# Patient Record
Sex: Female | Born: 2000 | Race: White | Hispanic: Yes | Marital: Single | Smoking: Never smoker
Health system: Southern US, Community
[De-identification: ages and names within clinical notes are randomized; demographics above are authoritative.]

## PROBLEM LIST (undated history)

## (undated) DIAGNOSIS — Z789 Other specified health status: Secondary | ICD-10-CM

## (undated) HISTORY — PX: NO PAST SURGERIES: SHX2092

## (undated) HISTORY — DX: Other specified health status: Z78.9

---
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
## 2022-04-27 ENCOUNTER — Ambulatory Visit (INDEPENDENT_AMBULATORY_CARE_PROVIDER_SITE_OTHER): Payer: Self-pay

## 2022-04-27 ENCOUNTER — Ambulatory Visit
Admission: EM | Admit: 2022-04-27 | Discharge: 2022-04-27 | Disposition: A | Discharge status: Home / Self Care | Payer: Self-pay | Attending: Nurse Practitioner | Admitting: Nurse Practitioner

## 2022-04-27 ENCOUNTER — Ambulatory Visit: Payer: Self-pay

## 2022-04-27 DIAGNOSIS — R079 Chest pain, unspecified: Secondary | ICD-10-CM

## 2022-04-27 DIAGNOSIS — R0789 Other chest pain: Secondary | ICD-10-CM

## 2022-04-27 DIAGNOSIS — F419 Anxiety disorder, unspecified: Secondary | ICD-10-CM

## 2022-04-27 MED ORDER — NAPROXEN 500 MG PO TABS: 500.0000 mg | ORAL_TABLET | Freq: Two times a day (BID) | ORAL | 0 refills | Status: AC

## 2022-04-27 MED ORDER — HYDROXYZINE HCL 25 MG PO TABS: 25.0000 mg | ORAL_TABLET | Freq: Four times a day (QID) | ORAL | 0 refills | Status: AC | PRN

## 2022-04-27 NOTE — ED Provider Notes (Signed)
[facility]    CSN: [id number] Arrival date & time: 04/27/22  1143      History   Chief Complaint Chief Complaint  Patient presents with   Chest Pain    HPI Barbara Payne is a 22 y.o. female Libby Maw for evaluation of chest pain.  Patient reports 1 month of a intermittent migrating stabbing type chest pain that is associated with back pain, bilateral arm pain that alternates.  States symptoms began after she lifted a heavy object 1 month ago, thinking it weighed somewhere around 50 pounds.  She denies shortness of breath, dizziness, syncope, palpitations, vomiting.  She does states she has intermittent nausea.  No smoking history.  Denies any history of hypertension, diabetes, hyperlipidemia.  Denies any lower extremity swelling orthopnea.  No hemoptysis.  No long distance travel.  She states she had the flu a month ago with the symptoms completely resolved.  She states pain is worse with deep inspiration and lifting of any objects and better/resolved with Motrin.  She is very concerned about the symptoms which is causing her anxiety to increase.  No other concerns at this time.   Chest Pain   History reviewed. No pertinent past medical history.  There are no problems to display for this patient.   History reviewed. No pertinent surgical history.  OB History   No obstetric history on file.      Home Medications    Prior to Admission medications   Medication Sig Start Date End Date Taking? Authorizing Provider  hydrOXYzine (ATARAX) 25 MG tablet Take 1 tablet (25 mg total) by mouth every 6 (six) hours as needed for anxiety. 04/27/22  Yes Melynda Ripple, NP  naproxen (NAPROSYN) 500 MG tablet Take 1 tablet (500 mg total) by mouth 2 (two) times daily for 7 days. 04/27/22 05/04/22 Yes Melynda Ripple, NP  lactobacillus acidophilus & bulgar (LACTINEX) chewable tablet Chew 1 tablet by mouth 3 (three) times daily with meals. For 5 days 05/04/14   Harlene Salts, MD  ondansetron  (ZOFRAN ODT) 4 MG disintegrating tablet Take 1 tablet (4 mg total) by mouth every 8 (eight) hours as needed. 05/04/14   Harlene Salts, MD    Family History History reviewed. No pertinent family history.  Social History Social History   Tobacco Use   Smoking status: Never   Smokeless tobacco: Never  Substance Use Topics   Alcohol use: Never   Drug use: Never     Allergies   Patient has no known allergies.   Review of Systems Review of Systems  Cardiovascular:  Positive for chest pain.     Physical Exam Triage Vital Signs ED Triage Vitals  Enc Vitals Group     BP 04/27/22 1211 117/79     Pulse Rate 04/27/22 1211 76     Resp 04/27/22 1211 18     Temp 04/27/22 1211 (!) 97.3 F (36.3 C)     Temp Source 04/27/22 1211 Oral     SpO2 04/27/22 1211 98 %     Weight --      Height --      Head Circumference --      Peak Flow --      Pain Score 04/27/22 1209 5     Pain Loc --      Pain Edu? --      Excl. in [place]? --    No data found.  Updated Vital Signs BP 117/79 (BP Location: Left Arm)   Pulse  76   Temp (!) 97.3 F (36.3 C) (Oral)   Resp 18   LMP 04/15/2022 (Approximate)   SpO2 98%   Visual Acuity Right Eye Distance:   Left Eye Distance:   Bilateral Distance:    Right Eye Near:   Left Eye Near:    Bilateral Near:     Physical Exam Vitals and nursing note reviewed.  Constitutional:      General: She is not in acute distress.    Appearance: Normal appearance. She is obese. She is not ill-appearing.  HENT:     Head: Normocephalic and atraumatic.  Eyes:     Pupils: Pupils are equal, round, and reactive to light.  Cardiovascular:     Rate and Rhythm: Normal rate and regular rhythm.     Heart sounds: Normal heart sounds. No murmur heard.    No friction rub.     Comments: There is tenderness to palpation to the entire chest that stops at the breast line.  She is also tender to palpation to bilateral arms, and to entire her upper and mid back.  No spinal  tenderness with palpation.  No neck pain.  Strength is 5 out of 5 bilateral upper extremities.  Equal chest expansion bilaterally. Pulmonary:     Effort: Pulmonary effort is normal. No respiratory distress.     Breath sounds: Normal breath sounds. No stridor. No wheezing, rhonchi or rales.  Chest:     Chest wall: Tenderness present.  Skin:    General: Skin is warm and dry.  Neurological:     General: No focal deficit present.     Mental Status: She is alert and oriented to person, place, and time.  Psychiatric:        Behavior: Behavior normal.     Comments: Patient crying intermittently     PERC Criteria for low probability for Pulmonary Embolism  Age <50 years  Heart rate <100 beats/minute  Oxyhemoglobin saturation ?95 percent  No hemoptysis  No estrogen use  No prior DVT or PE  No unilateral leg swelling  No surgery/trauma requiring hospitalization within the prior four weeks  Total score: 0     UC Treatments / Results  Labs (all labs ordered are listed, but only abnormal results are displayed) Labs Reviewed - No data to display  EKG   Radiology DG Chest 2 View  Result Date: 04/27/2022 CLINICAL DATA:  22 year old female with chest pain. EXAM: CHEST - 2 VIEW COMPARISON:  CT Abdomen and Pelvis 05/04/2014. FINDINGS: PA and lateral views at 1232 hours. Normal lung volumes and mediastinal contours. Visualized tracheal air column is within normal limits. Both lungs appear clear. No pneumothorax or pleural effusion. Negative visible bowel gas pattern, osseous structures. IMPRESSION: Negative.  No cardiopulmonary abnormality. Electronically Signed   By: Genevie Ann M.D.   On: 04/27/2022 12:40    Procedures ED EKG  Date/Time: 04/27/2022 12:44 PM  Performed by: Melynda Ripple, NP Authorized by: Melynda Ripple, NP   Previous ECG:    Previous ECG:  Unavailable Interpretation:    Interpretation: normal   Rate:    ECG rate:  68   ECG rate assessment: normal   Rhythm:     Rhythm: sinus rhythm   Ectopy:    Ectopy: none   ST segments:    ST segments:  Normal T waves:    T waves: normal    (including critical care time)  Medications Ordered in UC Medications - No data to display  Initial Impression / Assessment and Plan / UC Course  I have reviewed the triage vital signs and the nursing notes.  Pertinent labs & imaging results that were available during my care of the patient were reviewed by me and considered in my medical decision making (see chart for details).     Reviewed exam and symptoms with patient. Negative chest x-ray and EKG is unremarkable Discussed exam consistent with musculoskeletal cause.  PERC score is 0. Trial of naproxen twice daily for 7 days Trial of hydroxyzine as needed anxiety.  Side effect profile reviewed Patient does not have a PCP and did not want to try to set one up today.  I did strongly encourage her to set up with a PCP for further evaluation if symptoms persist as well as overall health maintenance ER precautions reviewed and she verbalized understanding Final Clinical Impressions(s) / UC Diagnoses   Final diagnoses:  Chest pain, unspecified type  Musculoskeletal chest pain  Anxiety     Discharge Instructions      Start naproxen twice daily for 7 days.  Take this with food Hydroxyzine every 6 hours as needed for anxiety.  Please note this medication can make you drowsy I encourage you to establish with a primary care doctor for further treatment of the symptoms if they persist and overall health maintenance Please go to the emergency room if you feel like your symptoms are worsening     ED Prescriptions     Medication Sig Dispense Auth. Provider   naproxen (NAPROSYN) 500 MG tablet Take 1 tablet (500 mg total) by mouth 2 (two) times daily for 7 days. 14 tablet Melynda Ripple, NP   hydrOXYzine (ATARAX) 25 MG tablet Take 1 tablet (25 mg total) by mouth every 6 (six) hours as needed for anxiety. 12 tablet  Melynda Ripple, NP      PDMP not reviewed this encounter.   Melynda Ripple, NP 04/27/22 1250

## 2022-04-27 NOTE — Discharge Instructions (Signed)
Start naproxen twice daily for 7 days.  Take this with food Hydroxyzine every 6 hours as needed for anxiety.  Please note this medication can make you drowsy I encourage you to establish with a primary care doctor for further treatment of the symptoms if they persist and overall health maintenance Please go to the emergency room if you feel like your symptoms are worsening

## 2022-04-27 NOTE — ED Triage Notes (Signed)
Pt c/o chest pressure and pain that started while lifting a heavy object x 1 month in the center of chest that radiates out to side of ribs with deep breathing and arm pain that rotates between left and right arms that feels like stabbing pain. Pt c/o Lightheadedness and dizziness at time, N, HA, and blurry vision with physical activity No F. Pt reports having the flu about 1 month ago with cough. Cough has resolved.  Home remedies: Motrin.

## 2022-05-31 ENCOUNTER — Emergency Department (HOSPITAL_COMMUNITY)
Admission: EM | Admit: 2022-05-31 | Discharge: 2022-06-01 | Disposition: A | Discharge status: Home / Self Care | Payer: Self-pay | Attending: Student | Admitting: Student

## 2022-05-31 DIAGNOSIS — R112 Nausea with vomiting, unspecified: Secondary | ICD-10-CM | POA: Insufficient documentation

## 2022-05-31 DIAGNOSIS — D72829 Elevated white blood cell count, unspecified: Secondary | ICD-10-CM | POA: Insufficient documentation

## 2022-05-31 DIAGNOSIS — R1031 Right lower quadrant pain: Secondary | ICD-10-CM | POA: Insufficient documentation

## 2022-05-31 MED ORDER — ONDANSETRON 4 MG PO TBDP
8.0000 mg | ORAL_TABLET | Freq: Once | ORAL | Status: AC
  Administered 2022-06-01: 8 mg via ORAL
  Filled 2022-05-31: qty 2

## 2022-05-31 MED ORDER — OXYCODONE-ACETAMINOPHEN 5-325 MG PO TABS
1.0000 | ORAL_TABLET | Freq: Once | ORAL | Status: AC
  Administered 2022-06-01: 1 via ORAL
  Filled 2022-05-31: qty 1

## 2022-05-31 NOTE — ED Triage Notes (Signed)
Pt reports RLQ pain starting today. She states no BM today. Nausea and vomiting.

## 2022-05-31 NOTE — ED Provider Triage Note (Signed)
Emergency Medicine Provider Triage Evaluation Note  Niyoka Sahlberg , a 22 y.o. female  was evaluated in triage.  Pt complains of right lower quadrant pain started few hours ago, came on suddenly, states it is severe in nature, does not radiate, has associated nausea vomiting, show states that she is constipated x 1 day, currently not on birth control, last menstrual cycle was last month, states she is regular, no history of abdominal surgeries ectopic pregnancy, ovarian cyst, had associated fevers and chills.  Review of Systems  Positive: Right lower quadrant pain, nausea Negative: Chest pain or shortness of breath  Physical Exam  BP 115/77 (BP Location: Right Arm)   Pulse 85   Temp 98.4 F (36.9 C)   Resp 20   LMP 05/16/2022   SpO2 100%  Gen:   Awake, no distress   Resp:  Normal effort  MSK:   Moves extremities without difficulty  Other:    Medical Decision Making  Medically screening exam initiated at 11:44 PM.  Appropriate orders placed.  Tanis Brecker was informed that the remainder of the evaluation will be completed by another provider, this initial triage assessment does not replace that evaluation, and the importance of remaining in the ED until their evaluation is complete.  Lab work imaging been ordered patient will need further workup.   Carroll Sage, PA-C 05/31/22 2345

## 2022-06-01 ENCOUNTER — Emergency Department (HOSPITAL_COMMUNITY): Payer: Self-pay

## 2022-06-01 LAB — COMPREHENSIVE METABOLIC PANEL
ALT: 31 U/L (ref 0–44)
AST: 23 U/L (ref 15–41)
Albumin: 4 g/dL (ref 3.5–5.0)
Alkaline Phosphatase: 80 U/L (ref 38–126)
Anion gap: 10 (ref 5–15)
BUN: 10 mg/dL (ref 6–20)
CO2: 24 mmol/L (ref 22–32)
Calcium: 9.2 mg/dL (ref 8.9–10.3)
Chloride: 103 mmol/L (ref 98–111)
Creatinine, Ser: 0.73 mg/dL (ref 0.44–1.00)
GFR, Estimated: >60 mL/min (ref >60)
Glucose, Bld: 112 mg/dL — ABNORMAL HIGH (ref 70–99)
Potassium: 4.1 mmol/L (ref 3.5–5.1)
Sodium: 137 mmol/L (ref 135–145)
Total Bilirubin: 0.4 mg/dL (ref 0.3–1.2)
Total Protein: 7.4 g/dL (ref 6.5–8.1)

## 2022-06-01 LAB — CBC WITH DIFFERENTIAL/PLATELET
Abs Immature Granulocytes: 0.03 10*3/uL (ref 0.00–0.07)
Basophils Absolute: 0.2 10*3/uL — ABNORMAL HIGH (ref 0.0–0.1)
Basophils Relative: 2 %
Eosinophils Absolute: 0.1 10*3/uL (ref 0.0–0.5)
Eosinophils Relative: 1 %
HCT: 41 % (ref 36.0–46.0)
Hemoglobin: 13.3 g/dL (ref 12.0–15.0)
Immature Granulocytes: 0 %
Lymphocytes Relative: 33 %
Lymphs Abs: 3.5 10*3/uL (ref 0.7–4.0)
MCH: 29.2 pg (ref 26.0–34.0)
MCHC: 32.4 g/dL (ref 30.0–36.0)
MCV: 89.9 fL (ref 80.0–100.0)
Monocytes Absolute: 0.9 10*3/uL (ref 0.1–1.0)
Monocytes Relative: 9 %
Neutro Abs: 5.8 10*3/uL (ref 1.7–7.7)
Neutrophils Relative %: 55 %
Platelets: 292 10*3/uL (ref 150–400)
RBC: 4.56 MIL/uL (ref 3.87–5.11)
RDW: 12.7 % (ref 11.5–15.5)
WBC: 10.6 10*3/uL — ABNORMAL HIGH (ref 4.0–10.5)
nRBC: 0 % (ref 0.0–0.2)

## 2022-06-01 LAB — I-STAT BETA HCG BLOOD, ED (MC, WL, AP ONLY): I-stat hCG, quantitative: <5 m[IU]/mL (ref <5)

## 2022-06-01 LAB — LIPASE, BLOOD: Lipase: 36 U/L (ref 11–51)

## 2022-06-01 LAB — I-STAT CHEM 8, ED
BUN: 12 mg/dL (ref 6–20)
Calcium, Ion: 1.18 mmol/L (ref 1.15–1.40)
Chloride: 101 mmol/L (ref 98–111)
Creatinine, Ser: 0.6 mg/dL (ref 0.44–1.00)
Glucose, Bld: 110 mg/dL — ABNORMAL HIGH (ref 70–99)
HCT: 40 % (ref 36.0–46.0)
Hemoglobin: 13.6 g/dL (ref 12.0–15.0)
Potassium: 3.9 mmol/L (ref 3.5–5.1)
Sodium: 138 mmol/L (ref 135–145)
TCO2: 27 mmol/L (ref 22–32)

## 2022-06-01 LAB — URINALYSIS, ROUTINE W REFLEX MICROSCOPIC
Bilirubin Urine: NEGATIVE
Glucose, UA: NEGATIVE mg/dL
Hgb urine dipstick: NEGATIVE
Ketones, ur: NEGATIVE mg/dL
Leukocytes,Ua: NEGATIVE
Nitrite: NEGATIVE
Protein, ur: NEGATIVE mg/dL
Specific Gravity, Urine: 1.021 (ref 1.005–1.030)
pH: 6 (ref 5.0–8.0)

## 2022-06-01 MED ORDER — SODIUM CHLORIDE 0.9 % IV BOLUS
1000.0000 mL | Freq: Once | INTRAVENOUS | Status: AC
  Administered 2022-06-01: 1000 mL via INTRAVENOUS

## 2022-06-01 MED ORDER — IOHEXOL 350 MG/ML SOLN
75.0000 mL | Freq: Once | INTRAVENOUS | Status: AC | PRN
  Administered 2022-06-01: 75 mL via INTRAVENOUS

## 2022-06-01 MED ORDER — ONDANSETRON HCL 4 MG/2ML IJ SOLN
4.0000 mg | Freq: Once | INTRAMUSCULAR | Status: AC
  Administered 2022-06-01: 4 mg via INTRAVENOUS
  Filled 2022-06-01: qty 2

## 2022-06-01 MED ORDER — ONDANSETRON 4 MG PO TBDP: 4.0000 mg | ORAL_TABLET | Freq: Three times a day (TID) | ORAL | 0 refills | Status: AC | PRN

## 2022-06-01 NOTE — ED Notes (Signed)
Pt alert, NAD, calm, interactive, resps e/u, speaking in clear complete sentences. Family at [facility]. Pt reports improved pain and nausea. Last BM yesterday / loose. Denies vaginal or urinary sx, or bleeding. "Feels better". Pending CT.

## 2022-06-01 NOTE — ED Provider Notes (Signed)
[place] EMERGENCY DEPARTMENT AT [facility] Provider Note   CSN: [id number] Arrival date & time: 05/31/22  2257     History  Chief Complaint  Patient presents with   Abdominal Pain    Barbara Payne is a 22 y.o. female.  HPI   Patient without significant medical history presents with complains of right lower quadrant pain started few hours ago, came on suddenly, states it is severe in nature, does not radiate, has associated nausea vomiting, show states that she is constipated x 1 day, currently not on birth control, last menstrual cycle was last month, states she is regular, no history of abdominal surgeries ectopic pregnancy, ovarian cyst, had associated fevers and chills, patient does not endorsing any urinary symptoms.  Home Medications Prior to Admission medications   Medication Sig Start Date End Date Taking? Authorizing Provider  hydrOXYzine (ATARAX) 25 MG tablet Take 1 tablet (25 mg total) by mouth every 6 (six) hours as needed for anxiety. 04/27/22   Radford Pax, NP  lactobacillus acidophilus & bulgar (LACTINEX) chewable tablet Chew 1 tablet by mouth 3 (three) times daily with meals. For 5 days 05/04/14   Ree Shay, MD  ondansetron (ZOFRAN ODT) 4 MG disintegrating tablet Take 1 tablet (4 mg total) by mouth every 8 (eight) hours as needed. 05/04/14   Ree Shay, MD      Allergies    Patient has no known allergies.    Review of Systems   Review of Systems  Constitutional:  Negative for chills and fever.  Respiratory:  Negative for shortness of breath.   Cardiovascular:  Negative for chest pain.  Gastrointestinal:  Positive for abdominal pain and nausea. Negative for vomiting.  Neurological:  Negative for headaches.    Physical Exam Updated Vital Signs BP (!) 105/55 (BP Location: Right Arm)   Pulse 67   Temp 97.6 F (36.4 C) (Oral)   Resp 18   LMP 05/16/2022   SpO2 100%  Physical Exam Vitals and nursing note reviewed.  Constitutional:       General: She is not in acute distress.    Appearance: She is not ill-appearing.  HENT:     Head: Normocephalic and atraumatic.     Nose: No congestion.  Eyes:     Conjunctiva/sclera: Conjunctivae normal.  Cardiovascular:     Rate and Rhythm: Normal rate and regular rhythm.     Pulses: Normal pulses.     Heart sounds: No murmur heard.    No friction rub. No gallop.  Pulmonary:     Effort: No respiratory distress.     Breath sounds: No wheezing, rhonchi or rales.  Abdominal:     Palpations: Abdomen is soft.     Tenderness: There is abdominal tenderness. There is no right CVA tenderness or left CVA tenderness.     Comments: Abdomen nondistended, soft, noted right lower quadrant tenderness without guarding rebound has or peritoneal sign.  No flank tenderness or CVA tenderness.  Musculoskeletal:     Right lower leg: No edema.     Left lower leg: No edema.  Skin:    General: Skin is warm and dry.  Neurological:     Mental Status: She is alert.  Psychiatric:        Mood and Affect: Mood normal.     ED Results / Procedures / Treatments   Labs (all labs ordered are listed, but only abnormal results are displayed) Labs Reviewed  COMPREHENSIVE METABOLIC PANEL - Abnormal; Notable for  the following components:      Result Value   Glucose, Bld 112 (*)    All other components within normal limits  CBC WITH DIFFERENTIAL/PLATELET - Abnormal; Notable for the following components:   WBC 10.6 (*)    Basophils Absolute 0.2 (*)    All other components within normal limits  URINALYSIS, ROUTINE W REFLEX MICROSCOPIC - Abnormal; Notable for the following components:   APPearance HAZY (*)    All other components within normal limits  I-STAT CHEM 8, ED - Abnormal; Notable for the following components:   Glucose, Bld 110 (*)    All other components within normal limits  LIPASE, BLOOD  I-STAT BETA HCG BLOOD, ED ([facility], [facility], [facility] ONLY)    EKG None  Radiology No results  found.  Procedures Procedures    Medications Ordered in ED Medications  ondansetron (ZOFRAN-ODT) disintegrating tablet 8 mg (8 mg Oral Given 06/01/22 0002)  oxyCODONE-acetaminophen (PERCOCET/ROXICET) 5-325 MG per tablet 1 tablet (1 tablet Oral Given 06/01/22 0003)  sodium chloride 0.9 % bolus 1,000 mL (1,000 mLs Intravenous New Bag/Given 06/01/22 0630)  ondansetron (ZOFRAN) injection 4 mg (4 mg Intravenous Given 06/01/22 0630)    ED Course/ Medical Decision Making/ A&P                             Medical Decision Making Amount and/or Complexity of Data Reviewed Labs: ordered. Radiology: ordered.  Risk Prescription drug management.   This patient presents to the ED for concern of right lower quadrant pain, this involves an extensive number of treatment options, and is a complaint that carries with it a high risk of complications and morbidity.  The differential diagnosis includes appendicitis, diverticulitis, ovarian torsion, ectopic pregnancy, kidney stone    Additional history obtained:  Additional history obtained from parent at bedside External records from outside source obtained and reviewed including recent ER notes   Co morbidities that complicate the patient evaluation  N/A  Social Determinants of Health:  No primary care provider    Lab Tests:  I Ordered, and personally interpreted labs.  The pertinent results include: CBC shows leukocytosis of 10.6, CMP reveals glucose of 112, lipase is 36, UA unremarkable, hCG negative   Imaging Studies ordered:  I ordered imaging studies including CT AP I independently visualized and interpreted imaging which showed pending I agree with the radiologist interpretation   Cardiac Monitoring:  The patient was maintained on a cardiac monitor.  I personally viewed and interpreted the cardiac monitored which showed an underlying rhythm of: N/A   Medicines ordered and prescription drug management:  I ordered  medication including fluids, pain medication I have reviewed the patients home medicines and have made adjustments as needed  Critical Interventions:  N/A   Reevaluation:  Presenting with right lower quadrant tenderness, triage obtained lab work shows leukocytosis, right lower quadrant tenderness, concern for appendicitis will send down for CT imaging for further evaluation.   Consultations Obtained:  N/A    Test Considered:  N/A    Rule out  I have low suspicion for liver or gallbladder abnormality as she has no right upper quadrant tenderness, liver enzymes, alk phos, T bili all within normal limits.  Low suspicion for pancreatitis as lipase is within normal limits.  Low suspicion for ruptured stomach ulcer as she has no peritoneal sign present on exam.  Low suspicion for bowel obstruction as abdomen is nondistended normal bowel sounds, so passing  gas and having normal bowel movements.  Low suspicion for complicated diverticulitis as she is nontoxic-appearing, vital signs reassuring.  Doubt UTI [place] not endorse the any urinary symptoms UA is negative side effects or hematuria.  Doubt ectopic pregnancy as urine pregnancy negative.    Dispostion and problem list  Due to shift change patient handed off to Lurena Nida, PA-C  Follow-up on CT scan, if negative for appendicitis and still has significant pain, would consider transvaginal sound for rule out of possible ovarian torsion.            Final Clinical Impression(s) / ED Diagnoses Final diagnoses:  Right lower quadrant abdominal pain    Rx / DC Orders ED Discharge Orders     None         Carroll Sage, PA-C 06/01/22 1610    Glynn Octave, MD 06/01/22 [phone]

## 2022-06-01 NOTE — Discharge Instructions (Addendum)
As we discussed, your workup in the ER today was reassuring for acute findings.  Laboratory evaluation and CT imaging did not reveal any emergent concerns.  Given that your pain is improved, it is reasonable for you to be discharged.  I have given you a prescription for Zofran to help with any residual nausea or vomiting that you may have.  If your pain does not improve or gets worse, as we discussed you would need to return for further workup and likely ultrasound imaging to rule out ovarian torsion.  Your CT scan did show some stool in your bowels, if you are having issues with constipation I recommend trying daily MiraLAX which she can get over-the-counter at your local pharmacy.  Return if development of any new or worsening symptoms.

## 2022-06-01 NOTE — ED Provider Notes (Signed)
Care assumed from Berle Mull, PA-C at shift change. Please see their note for further information.   Briefly: Patient with sudden onset RLQ abdominal pain yesterday evening with nausea and vomiting. Denies urinary symptoms, vaginal discharge. LMP at the end of last month. No hx abdominal surgeries.  Plan: Labs overall benign. CT pending will determine dispo.   Physical Exam  BP 108/61   Pulse 73   Temp 97.6 F (36.4 C) (Oral)   Resp 16   LMP 05/16/2022   SpO2 100%   Physical Exam Vitals and nursing note reviewed.  Constitutional:      General: She is not in acute distress.    Appearance: Normal appearance. She is normal weight. She is not ill-appearing, toxic-appearing or diaphoretic.  HENT:     Head: Normocephalic and atraumatic.  Cardiovascular:     Rate and Rhythm: Normal rate.  Pulmonary:     Effort: Pulmonary effort is normal. No respiratory distress.  Abdominal:     Tenderness: There is abdominal tenderness in the right lower quadrant. There is no guarding or rebound.  Musculoskeletal:        General: Normal range of motion.     Cervical back: Normal range of motion.  Skin:    General: Skin is warm and dry.  Neurological:     General: No focal deficit present.     Mental Status: She is alert.  Psychiatric:        Mood and Affect: Mood normal.        Behavior: Behavior normal.     Procedures  Procedures  ED Course / MDM    Medical Decision Making Amount and/or Complexity of Data Reviewed Labs: ordered. Radiology: ordered.  Risk Prescription drug management.   This patient is a 22 y.o. female who presents to the ED for concern of abdominal pain, this involves an extensive number of treatment options, and is a complaint that carries with it a high risk of complications and morbidity. The emergent differential diagnosis prior to evaluation includes, but is not limited to, gastroenteritis, appendicitis, Bowel obstruction, Bowel perforation.  Gastroparesis, DKA, Hernia, Inflammatory bowel disease, pancreatitis, volvulus, ovarian cyst rupture, ovarian torsion, ectopic pregnancy.  This is not an exhaustive differential.   Past Medical History / Co-morbidities / Social History: No pcp  Physical Exam: Physical exam performed. The pertinent findings include: RLQ TTP without rebound or guarding  Lab Tests: I ordered, and personally interpreted labs.  The pertinent results include:  WBC 10.6, labs overall reassuring   Imaging Studies: I ordered imaging studies including CT abdomen pelvis. I independently visualized and interpreted imaging which showed   Negative for appendicitis. No explanation for symptoms   I agree with the radiologist interpretation.   Medications: Previous provider ordered medication including percocet  for pain. Reevaluation of the patient after these medicines showed that the patient improved. I have reviewed the patients home medicines and have made adjustments as needed.  Disposition:  Patient presents today with 12 hours of RLQ pain. She is afebrile, non-toxic appearing, and in no acute distress with reassuring vital signs. Patient is nontoxic, nonseptic appearing, in no apparent distress.  Patient's pain and other symptoms adequately managed in emergency department.  Fluid bolus given.  Labs, imaging and vitals reviewed.  Patient does not meet the SIRS or Sepsis criteria.  On repeat exam patient does not have a surgical abdomin and there are no peritoneal signs.  No indication of appendicitis, bowel obstruction, bowel perforation, cholecystitis, diverticulitis, PID or  ectopic pregnancy. Did discuss with patient that sudden onset pain in this location could be indicative of ovarian torsionKorea and offered US to fully rule this out, however upon reassessment patient's pain is significantly improved and therefore she would prefer to go home which is reasonable. Does have some stool burden on imaging, pain likely due  to constipation vs gastroenteritis vs ovarian cyst rupture. Patient discharged home with Zofran for symptomatic treatment. Evaluation and diagnostic testing in the emergency department does not suggest an emergent condition requiring admission or immediate intervention beyond what has been performed at this time.  Plan for discharge with close PCP follow-up.  Patient is understanding and amenable with plan, educated on red flag symptoms that would prompt immediate return.  Patient discharged in stable condition.         Vear Clock 06/01/22 1610    Gwyneth Sprout, MD 06/01/22 1350

## 2022-06-01 NOTE — ED Notes (Signed)
Pt to CT via stretcher

## 2022-06-01 NOTE — ED Notes (Signed)
ED PA at BS 

## 2022-07-23 ENCOUNTER — Encounter: Payer: Self-pay | Admitting: Obstetrics & Gynecology

## 2022-07-25 ENCOUNTER — Encounter: Payer: Self-pay | Admitting: Obstetrics & Gynecology

## 2022-09-27 ENCOUNTER — Encounter: Payer: Self-pay | Admitting: Obstetrics and Gynecology

## 2024-03-05 ENCOUNTER — Ambulatory Visit: Payer: Self-pay | Admitting: *Deleted

## 2024-03-05 ENCOUNTER — Other Ambulatory Visit (HOSPITAL_COMMUNITY)
Admission: RE | Admit: 2024-03-05 | Discharge: 2024-03-05 | Disposition: A | Source: Ambulatory Visit | Attending: Obstetrics and Gynecology | Admitting: Obstetrics and Gynecology

## 2024-03-05 VITALS — BP 130/82 | HR 92 | Ht 62.0 in | Wt 238.6 lb

## 2024-03-05 DIAGNOSIS — Z3A18 18 weeks gestation of pregnancy: Secondary | ICD-10-CM

## 2024-03-05 DIAGNOSIS — Z3480 Encounter for supervision of other normal pregnancy, unspecified trimester: Secondary | ICD-10-CM | POA: Insufficient documentation

## 2024-03-05 DIAGNOSIS — Z131 Encounter for screening for diabetes mellitus: Secondary | ICD-10-CM

## 2024-03-05 DIAGNOSIS — Z3402 Encounter for supervision of normal first pregnancy, second trimester: Secondary | ICD-10-CM | POA: Diagnosis not present

## 2024-03-05 NOTE — Patient Instructions (Signed)
 [facility] [employer] has a partnership with the [employer] to provide prenatal navigation for the most needed resources in our community. In order to see how we can help connect you to these resources we need consent to contact you. Please complete the very short consent using the link below:   English Link: https://[place].tfaforms.net/283?site=16  Spanish Link: https://[place].tfaforms.net/287?site=16  Options for Doula Care in the [place] Area  As you review your birthing options, consider having a birth doula. A doula is trained to provide support before, during and just after you give birth. There are also postpartum doulas that help you adjust to new parenthood.  While doulas do not provide medical care, they do provide emotional, physical and educational support. A few months before your baby arrives, doulas can help answer questions, ease concerns and help you create and support your birthing plan.    Doulas can help reduce your stress and comfort you and your partner. They can help you cope with labor by helping you use breathing techniques, massage, creative labor positioning, essential oils and affirmations.   Studies show that the benefits of having a doula include:   A more positive birth experience  Fewer requests for pain-relief medication  Less likelihood of cesarean section, commonly called a c-section   Doulas are typically hired via a advertising account planner between you and the doula. We are happy to provide a list of the most active doulas in the area, all of whom are credentialed by [facility] and will not count as a visitor at your birth.  There are several options for no-cost doula care at our hospital, including:  [facility] Volunteer Doula Program Every [employer] Program (in [place] only) A Cure [employer] Moms Doula Study through [employer]  For more information on these programs or to receive a list of doulas active in our area, please  email [email or web address]

## 2024-03-05 NOTE — Progress Notes (Signed)
 New OB Intake  I connected with Barbara Payne  on 03/05/24 at  8:15 AM EST by In Person Visit and verified that I am speaking with the correct person using two identifiers. Nurse is located at [facility] and pt is located at [place].  I discussed the limitations, risks, security and privacy concerns of performing an evaluation and management service by telephone and the availability of in person appointments. I also discussed with the patient that there may be a patient responsible charge related to this service. The patient expressed understanding and agreed to proceed.  I explained I am completing New OB Intake today. We discussed EDD of 08/06/24 based on US  at [redacted]w[redacted]d weeks. Pt is G1P0000. I reviewed her allergies, medications and Medical/Surgical/OB history.    There are no active problems to display for this patient.    Concerns addressed today  Delivery Plans Plans to deliver at [facility]. Discussed the nature of our practice with multiple providers including residents and students as well as female and female providers. Due to the size of the practice, the delivering provider may not be the same as those providing prenatal care.   Patient is not interested in water birth.  MyChart/Babyscripts MyChart access verified. I explained pt will have some visits in office and some virtually. Babyscripts instructions given and order placed. Patient verifies receipt of registration text/e-mail. Account successfully created and app downloaded. If patient is a candidate for Optimized scheduling, add to sticky note.   Blood Pressure Cuff/Weight Scale Blood pressure cuff ordered for patient to pick-up from [employer]. Explained after first prenatal appt pt will check weekly and document in Babyscripts. Patient does not have weight scale; patient may purchase if they desire to track weight weekly in Babyscripts.  Anatomy US  Explained first scheduled US  will be around 19 weeks. Anatomy US   scheduled for TBD at TBD.  Is patient a candidate for Babyscripts Optimization? No, due to Risk Factors.   First visit review I reviewed new OB appt with patient. Explained pt will be seen by Dr. Rudy at first visit. Discussed [place] genetic screening with patient. Requests Panorama and Horizon.. Routine prenatal labs collected at today's visit.   Last Pap No results found for: DIAGPAP  Rocky CHRISTELLA Ober, RN 03/05/2024  9:02 AM

## 2024-03-06 LAB — CBC/D/PLT+RPR+RH+ABO+RUBIGG...
Antibody Screen: NEGATIVE
Basophils Absolute: 0.1 x10E3/uL (ref 0.0–0.2)
Basos: 1 %
EOS (ABSOLUTE): 0.1 x10E3/uL (ref 0.0–0.4)
Eos: 1 %
HCV Ab: NONREACTIVE
HIV Screen 4th Generation wRfx: NONREACTIVE
Hematocrit: 39.3 % (ref 34.0–46.6)
Hemoglobin: 12.7 g/dL (ref 11.1–15.9)
Hepatitis B Surface Ag: NEGATIVE
Immature Grans (Abs): 0 x10E3/uL (ref 0.0–0.1)
Immature Granulocytes: 0 %
Lymphocytes Absolute: 1.8 x10E3/uL (ref 0.7–3.1)
Lymphs: 20 %
MCH: 30 pg (ref 26.6–33.0)
MCHC: 32.3 g/dL (ref 31.5–35.7)
MCV: 93 fL (ref 79–97)
Monocytes Absolute: 0.6 x10E3/uL (ref 0.1–0.9)
Monocytes: 6 %
Neutrophils Absolute: 6.6 x10E3/uL (ref 1.4–7.0)
Neutrophils: 72 %
Platelets: 253 x10E3/uL (ref 150–450)
RBC: 4.23 x10E6/uL (ref 3.77–5.28)
RDW: 12.6 % (ref 11.7–15.4)
RPR Ser Ql: NONREACTIVE
Rh Factor: POSITIVE
Rubella Antibodies, IGG: 9.9 {index}
WBC: 9.2 x10E3/uL (ref 3.4–10.8)

## 2024-03-06 LAB — HEMOGLOBIN A1C
Est. average glucose Bld gHb Est-mCnc: 103 mg/dL
Hgb A1c MFr Bld: 5.2 % (ref 4.8–5.6)

## 2024-03-06 LAB — HCV INTERPRETATION

## 2024-03-07 LAB — CULTURE, OB URINE

## 2024-03-07 LAB — URINE CULTURE, OB REFLEX

## 2024-03-08 ENCOUNTER — Ambulatory Visit: Payer: Self-pay | Admitting: Obstetrics and Gynecology

## 2024-03-08 DIAGNOSIS — Z3480 Encounter for supervision of other normal pregnancy, unspecified trimester: Secondary | ICD-10-CM

## 2024-03-08 LAB — CERVICOVAGINAL ANCILLARY ONLY
Chlamydia: NEGATIVE
Comment: NEGATIVE
Comment: NORMAL
Neisseria Gonorrhea: NEGATIVE

## 2024-03-12 LAB — PANORAMA PRENATAL TEST FULL PANEL:PANORAMA TEST PLUS 5 ADDITIONAL MICRODELETIONS: FETAL FRACTION: 8.8

## 2024-03-18 ENCOUNTER — Other Ambulatory Visit (HOSPITAL_COMMUNITY)
Admission: RE | Admit: 2024-03-18 | Discharge: 2024-03-18 | Disposition: A | Source: Ambulatory Visit | Attending: Obstetrics | Admitting: Obstetrics

## 2024-03-18 ENCOUNTER — Ambulatory Visit: Payer: Self-pay | Admitting: Obstetrics

## 2024-03-18 ENCOUNTER — Encounter: Payer: Self-pay | Admitting: Obstetrics

## 2024-03-18 VITALS — BP 126/75 | HR 82 | Wt 242.6 lb

## 2024-03-18 DIAGNOSIS — B3731 Acute candidiasis of vulva and vagina: Secondary | ICD-10-CM

## 2024-03-18 DIAGNOSIS — N898 Other specified noninflammatory disorders of vagina: Secondary | ICD-10-CM | POA: Insufficient documentation

## 2024-03-18 DIAGNOSIS — Z23 Encounter for immunization: Secondary | ICD-10-CM | POA: Diagnosis not present

## 2024-03-18 DIAGNOSIS — O99212 Obesity complicating pregnancy, second trimester: Secondary | ICD-10-CM | POA: Diagnosis not present

## 2024-03-18 DIAGNOSIS — Z3A19 19 weeks gestation of pregnancy: Secondary | ICD-10-CM | POA: Diagnosis not present

## 2024-03-18 DIAGNOSIS — O99210 Obesity complicating pregnancy, unspecified trimester: Secondary | ICD-10-CM

## 2024-03-18 DIAGNOSIS — Z3400 Encounter for supervision of normal first pregnancy, unspecified trimester: Secondary | ICD-10-CM | POA: Insufficient documentation

## 2024-03-18 LAB — HORIZON CUSTOM: REPORT SUMMARY: NEGATIVE

## 2024-03-18 MED ORDER — TERCONAZOLE 0.8 % VA CREA: 1.0000 | TOPICAL_CREAM | Freq: Every day | VAGINAL | 0 refills | Status: AC

## 2024-03-18 NOTE — Progress Notes (Signed)
 Pt presents for new ob. Pt wants to get the flu vaccine today. No questions or concerns at this time.

## 2024-03-18 NOTE — Progress Notes (Signed)
 Subjective:    Barbara Payne is being seen today for her first obstetrical visit.  This is not a planned pregnancy. She is at [redacted]w[redacted]d gestation. Her obstetrical history is significant for none. Relationship with FOB: significant other, not living together. Patient does intend to breast feed. Pregnancy history fully reviewed.  The information documented in the HPI was reviewed and verified.  Menstrual History: OB History     Gravida  1   Para  0   Term  0   Preterm  0   AB  0   Living  0      SAB  0   IAB  0   Ectopic  0   Multiple  0   Live Births  0            Patient's last menstrual period was 10/31/2023.    Past Medical History:  Diagnosis Date   Medical history non-contributory     Past Surgical History:  Procedure Laterality Date   NO PAST SURGERIES      (Not in a hospital admission)  Allergies[1]  Social History   Tobacco Use   Smoking status: Never   Smokeless tobacco: Never  Substance Use Topics   Alcohol use: Never    Family History  Problem Relation Age of Onset   Hypertension Mother    Diabetes Father        Pre diabetes     Review of Systems Constitutional: negative for weight loss Gastrointestinal: negative for vomiting Genitourinary:negative for genital lesions and vaginal discharge and dysuria Musculoskeletal:negative for back pain Behavioral/Psych: negative for abusive relationship, depression, illegal drug usage and tobacco use    Objective:    BP 126/75   Pulse 82   Wt 242 lb 9.6 oz (110 kg)   LMP 10/31/2023   BMI 44.37 kg/m  General Appearance:    Alert, cooperative, no distress, appears stated age  Head:    Normocephalic, without obvious abnormality, atraumatic  Eyes:    PERRL, conjunctiva/corneas clear, EOM's intact, fundi    benign, both eyes  Ears:    Normal TM's and external ear canals, both ears  Nose:   Nares normal, septum midline, mucosa normal, no drainage    or sinus tenderness  Throat:    Lips, mucosa, and tongue normal; teeth and gums normal  Neck:   Supple, symmetrical, trachea midline, no adenopathy;    thyroid:  no enlargement/tenderness/nodules; no carotid   bruit or JVD  Back:     Symmetric, no curvature, ROM normal, no CVA tenderness  Lungs:     Clear to auscultation bilaterally, respirations unlabored  Chest Wall:    No tenderness or deformity   Heart:    Regular rate and rhythm, S1 and S2 normal, no murmur, rub   or gallop  Breast Exam:    No tenderness, masses, or nipple abnormality  Abdomen:     Soft, non-tender, bowel sounds active all four quadrants,    no masses, no organomegaly  Genitalia:    Normal female without lesion, discharge or tenderness  Extremities:   Extremities normal, atraumatic, no cyanosis or edema  Pulses:   2+ and symmetric all extremities  Skin:   Skin color, texture, turgor normal, no rashes or lesions  Lymph nodes:   Cervical, supraclavicular, and axillary nodes normal  Neurologic:   CNII-XII intact, normal strength, sensation and reflexes    throughout      Lab Review Urine pregnancy test Labs reviewed yes  Radiologic  studies reviewed no  Assessment:    Pregnancy at [redacted]w[redacted]d weeks    Plan:   1. Supervision of normal first pregnancy, antepartum (Primary) Rx: - Cytology - PAP( [place]) - Flu vaccine trivalent PF, 6mos and older(Flulaval,Afluria,Fluarix,Fluzone)  2. Vaginal discharge Rx: - Cervicovaginal ancillary only( )  3. Candida vaginitis Rx: - terconazole  (TERAZOL 3 ) 0.8 % vaginal cream; Place 1 applicator vaginally at bedtime.  Dispense: 20 g; Refill: 0  4. Obesity affecting pregnancy, antepartum, unspecified obesity type    Prenatal vitamins.  Counseling provided regarding continued use of seat belts, cessation of alcohol consumption, smoking or use of illicit drugs; infection precautions i.e., influenza/TDAP immunizations, toxoplasmosis,CMV, parvovirus, listeria and varicella; workplace safety,  exercise during pregnancy; routine dental care, safe medications, sexual activity, hot tubs, saunas, pools, travel, caffeine use, fish and methlymercury, potential toxins, hair treatments, varicose veins Weight gain recommendations per IOM guidelines reviewed: underweight/BMI< 18.5--> gain 28 - 40 lbs; normal weight/BMI 18.5 - 24.9--> gain 25 - 35 lbs; overweight/BMI 25 - 29.9--> gain 15 - 25 lbs; obese/BMI >30->gain  11 - 20 lbs Problem list reviewed and updated. FIRST/CF mutation testing/NIPT/QUAD SCREEN/fragile X/Ashkenazi Jewish population testing/Spinal muscular atrophy discussed: requested. Role of ultrasound in pregnancy discussed; fetal survey: requested. Amniocentesis discussed: not indicated.  Meds ordered this encounter  Medications   terconazole  (TERAZOL 3 ) 0.8 % vaginal cream    Sig: Place 1 applicator vaginally at bedtime.    Dispense:  20 g    Refill:  0   Orders Placed This Encounter  Procedures   Flu vaccine trivalent PF, 6mos and older(Flulaval,Afluria,Fluarix,Fluzone)    Follow up in 4 weeks.  I have spent a total of 25 minutes of face-to-face time, excluding clinical staff time, reviewing notes and preparing to see patient, ordering tests and/or medications, and counseling the patient.   CARLIN RONAL CENTERS, MD, FACOG Attending Obstetrician & Gynecologist, [facility], [facility], [place] 03/18/2024      [1] No Known Allergies

## 2024-03-19 ENCOUNTER — Ambulatory Visit: Payer: Self-pay | Admitting: Obstetrics

## 2024-03-19 LAB — CERVICOVAGINAL ANCILLARY ONLY
Bacterial Vaginitis (gardnerella): NEGATIVE
Candida Glabrata: NEGATIVE
Candida Vaginitis: POSITIVE — AB
Chlamydia: NEGATIVE
Comment: NEGATIVE
Comment: NEGATIVE
Comment: NEGATIVE
Comment: NEGATIVE
Comment: NEGATIVE
Comment: NORMAL
Neisseria Gonorrhea: NEGATIVE
Trichomonas: NEGATIVE

## 2024-03-19 LAB — CYTOLOGY - PAP
Chlamydia: NEGATIVE
Comment: NEGATIVE
Comment: NORMAL
Diagnosis: NEGATIVE
Neisseria Gonorrhea: NEGATIVE

## 2024-04-05 ENCOUNTER — Ambulatory Visit

## 2024-04-05 ENCOUNTER — Other Ambulatory Visit

## 2024-04-05 DIAGNOSIS — Z3480 Encounter for supervision of other normal pregnancy, unspecified trimester: Secondary | ICD-10-CM

## 2024-04-15 ENCOUNTER — Encounter: Payer: Self-pay | Admitting: Family Medicine
